# Patient Record
Sex: Male | Born: 2000
Health system: Southern US, Community
[De-identification: ages and names within clinical notes are randomized; demographics above are authoritative.]

## PROBLEM LIST (undated history)

## (undated) DIAGNOSIS — R6251 Failure to thrive (child): Secondary | ICD-10-CM

## (undated) DIAGNOSIS — R011 Cardiac murmur, unspecified: Secondary | ICD-10-CM

## (undated) DIAGNOSIS — R51 Headache: Secondary | ICD-10-CM

## (undated) DIAGNOSIS — T7840XA Allergy, unspecified, initial encounter: Secondary | ICD-10-CM

## (undated) HISTORY — PX: APPENDECTOMY: SHX54

## (undated) HISTORY — DX: Failure to thrive (child): R62.51

## (undated) HISTORY — DX: Headache: R51

## (undated) HISTORY — DX: Allergy, unspecified, initial encounter: T78.40XA

## (undated) HISTORY — PX: TYMPANOSTOMY TUBE PLACEMENT: SHX32

## (undated) HISTORY — PX: TONSILECTOMY, ADENOIDECTOMY, BILATERAL MYRINGOTOMY AND TUBES: SHX2538

## (undated) HISTORY — PX: CIRCUMCISION: SUR203

## (undated) HISTORY — PX: ADENOIDECTOMY: SUR15

---
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
## 2019-03-03 DIAGNOSIS — T781XXD Other adverse food reactions, not elsewhere classified, subsequent encounter: Secondary | ICD-10-CM | POA: Diagnosis not present

## 2019-03-03 DIAGNOSIS — J301 Allergic rhinitis due to pollen: Secondary | ICD-10-CM | POA: Diagnosis not present

## 2019-07-14 ENCOUNTER — Telehealth: Payer: Self-pay | Admitting: Pediatrics

## 2019-07-14 NOTE — Telephone Encounter (Signed)
College form on your desk to fill out please °

## 2019-07-19 NOTE — Telephone Encounter (Signed)
Child medical report filled  

## 2019-07-21 ENCOUNTER — Other Ambulatory Visit: Payer: Self-pay

## 2019-07-21 ENCOUNTER — Ambulatory Visit (INDEPENDENT_AMBULATORY_CARE_PROVIDER_SITE_OTHER): Payer: 59 | Admitting: Pediatrics

## 2019-07-21 ENCOUNTER — Encounter: Payer: Self-pay | Admitting: Pediatrics

## 2019-07-21 VITALS — BP 100/60 | Ht 64.0 in | Wt 106.3 lb

## 2019-07-21 DIAGNOSIS — Z00129 Encounter for routine child health examination without abnormal findings: Secondary | ICD-10-CM

## 2019-07-21 DIAGNOSIS — Z23 Encounter for immunization: Secondary | ICD-10-CM

## 2019-07-21 DIAGNOSIS — Z0000 Encounter for general adult medical examination without abnormal findings: Secondary | ICD-10-CM | POA: Diagnosis not present

## 2019-07-21 DIAGNOSIS — Z68.41 Body mass index (BMI) pediatric, 5th percentile to less than 85th percentile for age: Secondary | ICD-10-CM | POA: Diagnosis not present

## 2019-07-21 NOTE — Patient Instructions (Signed)

## 2019-07-21 NOTE — Progress Notes (Signed)
Adolescent Well Care Visit Gregory Powers is a 18 y.o. male who is here for well care.    PCP:  Georgiann Hahnamgoolam, Andres, MD   History was provided by the patient.  Confidentiality was discussed with the patient and, if applicable, with caregiver as well.   Current Issues: Current concerns include:   No concerns.  Starting college soon and needs forms filled out.   Nutrition: Nutrition/Eating Behaviors: good eater, 3 meals/day plus snacks, all food groups, mainly drinks water, milk occasional soda Adequate calcium in diet?: adequate Supplements/ Vitamins: none  Exercise/ Media: Play any Sports?/ Exercise: Lacrosse Screen Time:  > 2 hours-counseling provided Media Rules or Monitoring?: yes  Sleep:  Sleep: 12-10am  Social Screening: Lives with:  Mom, dad Parental relations:  good Activities, Work, and Regulatory affairs officerChores?: yes Concerns regarding behavior with peers?  no Stressors of note: no  Education: School Name: Graduated and was [facility] off to [employer]  School performance: doing well; no concerns School Behavior: doing well; no concerns  Menstruation:   No LMP for male patient.   Confidential Social History: Tobacco?  no Secondhand smoke exposure?  no Drugs/ETOH?  Has tried weed before in past  Sexually Active?  no   Pregnancy Prevention: discussed  Safe at home, in school & in relationships?  Yes Safe to self?  Yes   Screenings: Patient has a dental home: yes, has dentist, brush bid   eating habits, bullying, abuse and/or trauma, tobacco use, other substance use and mental health.  Issues were addressed and counseling provided.  Additional topics were addressed as anticipatory guidance.  PHQ-9 completed and results indicated score 3, no concerns  Physical Exam:  Vitals:   07/21/19 0945  BP: 100/60  Weight: 106 lb 4.8 oz (48.2 kg)  Height: 5\' 4"  (1.626 m)   BP 100/60   Ht 5\' 4"  (1.626 m)   Wt 106 lb 4.8 oz (48.2 kg)   BMI 18.25 kg/m  Body mass index: body mass index  is 18.25 kg/m. Blood pressure percentiles are not available for patients who are 18 years or older.   Hearing Screening   125Hz  250Hz  500Hz  1000Hz  2000Hz  3000Hz  4000Hz  6000Hz  8000Hz   Right ear:   25 20 20 20 20     Left ear:   25 20 20 20 20       Visual Acuity Screening   Right eye Left eye Both eyes  Without correction: 10/10 10/12.5   With correction:       General Appearance:   alert, oriented, no acute distress and well nourished  HENT: Normocephalic, no obvious abnormality, conjunctiva clear  Mouth:   Normal appearing teeth, no obvious discoloration, dental caries, or dental caps  Neck:   Supple; thyroid: no enlargement, symmetric, no tenderness/mass/nodules  Chest Normal male  Lungs:   Clear to auscultation bilaterally, normal work of breathing  Heart:   Regular rate and rhythm, S1 and S2 normal, no murmurs;   Abdomen:   Soft, non-tender, no mass, or organomegaly  GU genitalia not examined  Musculoskeletal:   Tone and strength strong and symmetrical, all extremities           No scoliosis    Lymphatic:   No cervical adenopathy  Skin/Hair/Nails:   Skin warm, dry and intact, no rashes, no bruises or petechiae  Neurologic:   Strength, gait, and coordination normal and age-appropriate     Assessment and Plan:   1. Encounter for routine child health examination without abnormal findings   2.  BMI (body mass index), pediatric, 5% to less than 85% for age    --plan to look for adult PCP to transition to.   --fill out college immunization form.  --Noted h/o short stature that was seen by Endo a few years ago.  Appears his height has caught up some and now on 3% and close to mid parental height.   BMI is appropriate for age  Hearing screening result:normal Vision screening result: normal  Counseling provided for all of the vaccine components  Orders Placed This Encounter  Procedures  . Meningococcal conjugate vaccine (Menactra)   --Decline Men B and HPV.  Given  information and to discuss with parents to return later to get.   --Indications, contraindications and side effects of vaccine/vaccines discussed with parent and parent verbally expressed understanding and also agreed with the administration of vaccine/vaccines as ordered above  today.    Return in about 1 year (around 07/20/2020), or discussed need to look for family doctor.Marland Kitchen  Kristen Loader, DO

## 2019-07-28 ENCOUNTER — Telehealth: Payer: Self-pay | Admitting: Pediatrics

## 2019-07-28 NOTE — Telephone Encounter (Signed)
College form on your desk

## 2019-07-28 NOTE — Telephone Encounter (Signed)
Child medical report filled  

## 2020-05-03 ENCOUNTER — Telehealth (INDEPENDENT_AMBULATORY_CARE_PROVIDER_SITE_OTHER): Payer: Self-pay | Admitting: Pediatric Endocrinology

## 2020-05-03 NOTE — Telephone Encounter (Signed)
I received a staff message from Dr. Vanessa Ladd stating she had spoken to mother of patient and patient needs an appointment scheduled with her in a one hour slot. I called and spoke to mother who stated she will call us back to schedule later today. Please schedule in a one hour slot. If Dr. Vanessa Hollins does not have a one hour slot, reach out to her for instructions. Rufina Falco

## 2023-08-14 ENCOUNTER — Emergency Department (HOSPITAL_BASED_OUTPATIENT_CLINIC_OR_DEPARTMENT_OTHER)
Admission: EM | Admit: 2023-08-14 | Discharge: 2023-08-14 | Disposition: A | Discharge status: Home / Self Care | Payer: 59 | Attending: Emergency Medicine | Admitting: Emergency Medicine

## 2023-08-14 ENCOUNTER — Encounter (HOSPITAL_BASED_OUTPATIENT_CLINIC_OR_DEPARTMENT_OTHER): Payer: Self-pay

## 2023-08-14 ENCOUNTER — Emergency Department (HOSPITAL_BASED_OUTPATIENT_CLINIC_OR_DEPARTMENT_OTHER): Payer: 59 | Admitting: Radiology

## 2023-08-14 ENCOUNTER — Other Ambulatory Visit: Payer: Self-pay

## 2023-08-14 DIAGNOSIS — S52611A Displaced fracture of right ulna styloid process, initial encounter for closed fracture: Secondary | ICD-10-CM | POA: Insufficient documentation

## 2023-08-14 DIAGNOSIS — Y9351 Activity, roller skating (inline) and skateboarding: Secondary | ICD-10-CM | POA: Diagnosis not present

## 2023-08-14 DIAGNOSIS — S60512A Abrasion of left hand, initial encounter: Secondary | ICD-10-CM | POA: Diagnosis not present

## 2023-08-14 DIAGNOSIS — S60511A Abrasion of right hand, initial encounter: Secondary | ICD-10-CM | POA: Insufficient documentation

## 2023-08-14 DIAGNOSIS — S6991XA Unspecified injury of right wrist, hand and finger(s), initial encounter: Secondary | ICD-10-CM | POA: Diagnosis present
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
## 2023-08-14 NOTE — ED Notes (Signed)
Patient verbalizes understanding of discharge instructions. Opportunity for questioning and answers were provided. Patient discharged from ED.  °

## 2023-08-14 NOTE — Discharge Instructions (Signed)
Please take 600 mg of ibuprofen every 6 hours as needed for pain.  Please follow-up with hand surgery for the fracture of your wrist.  You may return to the emergency room for any worsening symptoms.

## 2023-08-14 NOTE — ED Triage Notes (Signed)
Pt states he fell off skateboard, presents w abrasions to bilateral palms. States he is more concerned about abrasion to L hand "because it's deep. Bleeding controlled at time of triage w dressings. Tetanus UTD
